# Patient Record
Sex: Female | Born: 1960 | Race: White | Hispanic: No | Marital: Married | Smoking: Never smoker
Health system: Southern US, Community
[De-identification: ages and names within clinical notes are randomized; demographics above are authoritative.]

## PROBLEM LIST (undated history)

## (undated) DIAGNOSIS — D6852 Prothrombin gene mutation: Secondary | ICD-10-CM

## (undated) DIAGNOSIS — Z1589 Genetic susceptibility to other disease: Secondary | ICD-10-CM

## (undated) DIAGNOSIS — E7212 Methylenetetrahydrofolate reductase deficiency: Secondary | ICD-10-CM

---

## 2015-06-27 ENCOUNTER — Encounter (HOSPITAL_COMMUNITY): Payer: Self-pay | Admitting: *Deleted

## 2015-06-27 ENCOUNTER — Emergency Department (HOSPITAL_COMMUNITY): Payer: Managed Care, Other (non HMO)

## 2015-06-27 ENCOUNTER — Emergency Department (HOSPITAL_COMMUNITY)
Admission: EM | Admit: 2015-06-27 | Discharge: 2015-06-28 | Disposition: A | Discharge status: Home / Self Care | Payer: Managed Care, Other (non HMO) | Attending: Emergency Medicine | Admitting: Emergency Medicine

## 2015-06-27 DIAGNOSIS — Z862 Personal history of diseases of the blood and blood-forming organs and certain disorders involving the immune mechanism: Secondary | ICD-10-CM | POA: Diagnosis not present

## 2015-06-27 DIAGNOSIS — Z8639 Personal history of other endocrine, nutritional and metabolic disease: Secondary | ICD-10-CM | POA: Insufficient documentation

## 2015-06-27 DIAGNOSIS — R9431 Abnormal electrocardiogram [ECG] [EKG]: Secondary | ICD-10-CM | POA: Insufficient documentation

## 2015-06-27 DIAGNOSIS — R0789 Other chest pain: Secondary | ICD-10-CM | POA: Diagnosis not present

## 2015-06-27 DIAGNOSIS — R1013 Epigastric pain: Secondary | ICD-10-CM

## 2015-06-27 DIAGNOSIS — R079 Chest pain, unspecified: Secondary | ICD-10-CM | POA: Diagnosis present

## 2015-06-27 HISTORY — DX: Methylenetetrahydrofolate reductase deficiency: E72.12

## 2015-06-27 HISTORY — DX: Genetic susceptibility to other disease: Z15.89

## 2015-06-27 HISTORY — DX: Prothrombin gene mutation: D68.52

## 2015-06-27 LAB — CBC
HEMATOCRIT: 39.1 % (ref 36.0–46.0)
HEMOGLOBIN: 12.7 g/dL (ref 12.0–15.0)
MCH: 30.1 pg (ref 26.0–34.0)
MCHC: 32.5 g/dL (ref 30.0–36.0)
MCV: 92.7 fL (ref 78.0–100.0)
Platelets: 141 10*3/uL — ABNORMAL LOW (ref 150–400)
RBC: 4.22 MIL/uL (ref 3.87–5.11)
RDW: 13.2 % (ref 11.5–15.5)
WBC: 4.7 10*3/uL (ref 4.0–10.5)

## 2015-06-27 LAB — BASIC METABOLIC PANEL
Anion gap: 11 (ref 5–15)
BUN: 15 mg/dL (ref 6–20)
CALCIUM: 9.9 mg/dL (ref 8.9–10.3)
CO2: 28 mmol/L (ref 22–32)
CREATININE: 0.63 mg/dL (ref 0.44–1.00)
Chloride: 103 mmol/L (ref 101–111)
GFR calc non Af Amer: >60 mL/min (ref >60)
Glucose, Bld: 103 mg/dL — ABNORMAL HIGH (ref 65–99)
Potassium: 4.6 mmol/L (ref 3.5–5.1)
SODIUM: 142 mmol/L (ref 135–145)

## 2015-06-27 LAB — I-STAT TROPONIN, ED: TROPONIN I, POC: 0 ng/mL (ref 0.00–0.08)

## 2015-06-27 NOTE — ED Provider Notes (Signed)
CSN: [id number]     Arrival date & time 06/27/15  1812 History  By signing my name below, I, Bethel Born, attest that this documentation has been prepared under the direction and in the presence of Derwood Kaplan, MD. Electronically Signed: Bethel Born, ED Scribe. 06/28/2015. 12:23 AM   Chief Complaint  Patient presents with  . Chest Pain  . Abnormal ECG    The history is provided by the patient. No language interpreter was used.   Deanna Ray is a 55 y.o. female who presents to the Emergency Department complaining of new,  Intermittent,  pressure-like, non-radiating, substernal chest pain with onset last evening. Pt states that her symptoms started with epigastric pain near 3 PM, after lunch. The chest pains started later in the day and lasted for 1.5 hours with no identifiable triggers, pattern, or modifying factors. The chest pain has resolved but she still has some epigastric discomfort. She has been having the epigastric discomfort with associated belching intermittently for several months. That pain typically occurs after meals. Pt denies dizziness, nausea, and SOB. She was seen at Urgent Care and referred to the ED. She has no significant cardiac history or history of DM or HTN. Her family history includes HTN but not cardiac disease. She does not smoke. Pt has used an 81 mg aspirin daily for the last 10 years.    Past Medical History  Diagnosis Date  . MTHFR mutation (methylenetetrahydrofolate reductase) (HCC)   . 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 No pertinent past surgical history. History reviewed. No pertinent family history. Social History  Substance Use Topics  . Smoking status: Never Smoker   . Smokeless tobacco: None  . Alcohol Use: Yes     Comment: occ   OB History    No data available     Review of Systems  10 Systems reviewed and all are negative for acute change except as noted in the HPI.  Allergies  Review of patient's  allergies indicates no known allergies.  Home Medications   Prior to Admission medications   Medication Sig Start Date End Date Taking? Authorizing Provider  ranitidine (ZANTAC) 150 MG tablet Take 1 tablet (150 mg total) by mouth 2 (two) times daily. 06/28/15   Arles Rumbold, MD   BP 112/61 mmHg  Pulse 79  Temp(Src) 97.7 F (36.5 C) (Oral)  Resp 12  SpO2 100% Physical Exam  Constitutional: She is oriented to person, place, and time. She appears well-developed and well-nourished. No distress.  HENT:  Head: Normocephalic and atraumatic.  Eyes: EOM are normal.  Neck: Normal range of motion.  Cardiovascular: Normal rate, regular rhythm and normal heart sounds.   Pulmonary/Chest: Effort normal and breath sounds normal.  CTAB  Abdominal: Soft. She exhibits no distension and no mass. There is no tenderness. There is no rebound and no guarding.  Musculoskeletal: Normal range of motion.  Neurological: She is alert and oriented to person, place, and time.  Skin: Skin is warm and dry.  Psychiatric: She has a normal mood and affect. Judgment normal.  Nursing note and vitals reviewed.   ED Course  Procedures (including critical care time) DIAGNOSTIC STUDIES: Oxygen Saturation is 100% on RA,  normal by my interpretation.    COORDINATION OF CARE: 12:18 AM Discussed treatment plan which includes lab work, CXR, EKG with pt at bedside and pt agreed to plan.  Labs Review Labs Reviewed  BASIC METABOLIC PANEL - Abnormal; Notable for the following:  Glucose, Bld 103 (*)    All other components within normal limits  CBC - Abnormal; Notable for the following:    Platelets 141 (*)    All other components within normal limits  I-STAT TROPOININ, ED  I-STAT TROPOININ, ED    Imaging Review No results found. I have personally reviewed and evaluated these images and lab results as part of my medical decision-making.   EKG Interpretation   Date/Time:  Saturday June 27 2015 18:19:38  EST Ventricular Rate:  61 PR Interval:  156 QRS Duration: 76 QT Interval:  408 QTC Calculation: 410 R Axis:   88 Text Interpretation:  Normal sinus rhythm Possible Left atrial enlargement  Borderline ECG No acute changes normal axis   No old tracing to compare  Confirmed by Rhunette Croft, MD, Janey Genta [phone]) on 06/27/2015 11:58:11 PM      MDM   Final diagnoses:  Acute epigastric pain  Atypical chest pain    I personally performed the services described in this documentation, which was scribed in my presence. The recorded information has been reviewed and is accurate.  Pt comes in with epigastric pain and atypical chest pain. HEAR score < 3 - will get trops x 2. Also  - she has been on baby aspirin for several years - will advise PCP f/u for possible PUD.   Derwood Kaplan, MD 06/30/15 2137

## 2015-06-27 NOTE — ED Notes (Addendum)
Pt reports onset today of upper abd pain, than began radiating into mid chest. Denies sob or n/v. [facility] and sent here due to abnormal ekg. Has clotting disorder, had 11 hr car ride yesterday.

## 2015-06-28 LAB — I-STAT TROPONIN, ED: TROPONIN I, POC: 0 ng/mL (ref 0.00–0.08)

## 2015-06-28 MED ORDER — ASPIRIN 81 MG PO CHEW
162.0000 mg | CHEWABLE_TABLET | Freq: Once | ORAL | Status: AC
  Administered 2015-06-28: 162 mg via ORAL
  Filled 2015-06-28: qty 2

## 2015-06-28 MED ORDER — RANITIDINE HCL 150 MG PO TABS: 150.0000 mg | ORAL_TABLET | Freq: Two times a day (BID) | ORAL | Status: AC

## 2015-06-28 NOTE — Discharge Instructions (Signed)
We saw you in the ER for the chest pain and the abdominal pain. All of our cardiac workup is normal, including labs, EKG and chest X-RAY are normal. We are not sure what is causing your chest discomfort, but we feel comfortable sending you home at this time. The workup in the ER is not complete, and you should follow up with your primary care doctor for further evaluation.  The upper abdomen pain could be due to gastritis. Start Zantac other antacid meds and see your doctor as discussed.   Nonspecific Chest Pain  Chest pain can be caused by many different conditions. There is always a chance that your pain could be related to something serious, such as a heart attack or a blood clot in your lungs. Chest pain can also be caused by conditions that are not life-threatening. If you have chest pain, it is very important to follow up with your health care provider. CAUSES  Chest pain can be caused by:  Heartburn.  Pneumonia or bronchitis.  Anxiety or stress.  Inflammation around your heart (pericarditis) or lung (pleuritis or pleurisy).  A blood clot in your lung.  A collapsed lung (pneumothorax). It can develop suddenly on its own (spontaneous pneumothorax) or from trauma to the chest.  Shingles infection (varicella-zoster virus).  Heart attack.  Damage to the bones, muscles, and cartilage that make up your chest wall. This can include:  Bruised bones due to injury.  Strained muscles or cartilage due to frequent or repeated coughing or overwork.  Fracture to one or more ribs.  Sore cartilage due to inflammation (costochondritis). RISK FACTORS  Risk factors for chest pain may include:  Activities that increase your risk for trauma or injury to your chest.  Respiratory infections or conditions that cause frequent coughing.  Medical conditions or overeating that can cause heartburn.  Heart disease or family history of heart disease.  Conditions or health behaviors that  increase your risk of developing a blood clot.  Having had chicken pox (varicella zoster). SIGNS AND SYMPTOMS Chest pain can feel like:  Burning or tingling on the surface of your chest or deep in your chest.  Crushing, pressure, aching, or squeezing pain.  Dull or sharp pain that is worse when you move, cough, or take a deep breath.  Pain that is also felt in your back, neck, shoulder, or arm, or pain that spreads to any of these areas. Your chest pain may come and go, or it may stay constant. DIAGNOSIS Lab tests or other studies may be needed to find the cause of your pain. Your health care provider may have you take a test called an ambulatory ECG (electrocardiogram). An ECG records your heartbeat patterns at the time the test is performed. You may also have other tests, such as:  Transthoracic echocardiogram (TTE). During echocardiography, sound waves are used to create a picture of all of the heart structures and to look at how blood flows through your heart.  Transesophageal echocardiogram (TEE).This is a more advanced imaging test that obtains images from inside your body. It allows your health care provider to see your heart in finer detail.  Cardiac monitoring. This allows your health care provider to monitor your heart rate and rhythm in real time.  Holter monitor. This is a portable device that records your heartbeat and can help to diagnose abnormal heartbeats. It allows your health care provider to track your heart activity for several days, if needed.  Stress tests. These can be  done through exercise or by taking medicine that makes your heart beat more quickly.  Blood tests.  Imaging tests. TREATMENT  Your treatment depends on what is causing your chest pain. Treatment may include:  Medicines. These may include:  Acid blockers for heartburn.  Anti-inflammatory medicine.  Pain medicine for inflammatory conditions.  Antibiotic medicine, if an infection is  present.  Medicines to dissolve blood clots.  Medicines to treat coronary artery disease.  Supportive care for conditions that do not require medicines. This may include:  Resting.  Applying heat or cold packs to injured areas.  Limiting activities until pain decreases. HOME CARE INSTRUCTIONS  If you were prescribed an antibiotic medicine, finish it all even if you start to feel better.  Avoid any activities that bring on chest pain.  Do not use any tobacco products, including cigarettes, chewing tobacco, or electronic cigarettes. If you need help quitting, ask your health care provider.  Do not drink alcohol.  Take medicines only as directed by your health care provider.  Keep all follow-up visits as directed by your health care provider. This is important. This includes any further testing if your chest pain does not go away.  If heartburn is the cause for your chest pain, you may be told to keep your head raised (elevated) while sleeping. This reduces the chance that acid will go from your stomach into your esophagus.  Make lifestyle changes as directed by your health care provider. These may include:  Getting regular exercise. Ask your health care provider to suggest some activities that are safe for you.  Eating a heart-healthy diet. A registered dietitian can help you to learn healthy eating options.  Maintaining a healthy weight.  Managing diabetes, if necessary.  Reducing stress. SEEK MEDICAL CARE IF:  Your chest pain does not go away after treatment.  You have a rash with blisters on your chest.  You have a fever. SEEK IMMEDIATE MEDICAL CARE IF:   Your chest pain is worse.  You have an increasing cough, or you cough up blood.  You have severe abdominal pain.  You have severe weakness.  You faint.  You have chills.  You have sudden, unexplained chest discomfort.  You have sudden, unexplained discomfort in your arms, back, neck, or jaw.  You  have shortness of breath at any time.  You suddenly start to sweat, or your skin gets clammy.  You feel nauseous or you vomit.  You suddenly feel light-headed or dizzy.  Your heart begins to beat quickly, or it feels like it is skipping beats. These symptoms may represent a serious problem that is an emergency. Do not wait to see if the symptoms will go away. Get medical help right away. Call your local emergency services (911 in the [place] Do not drive yourself to the hospital.   This information is not intended to replace advice given to you by your health care provider. Make sure you discuss any questions you have with your health care provider.   Document Released: 02/02/2005 Document Revised: 05/16/2014 Document Reviewed: 11/29/2013 Elsevier Interactive Patient Education 2016 [employer] Peptic Ulcer A peptic ulcer is a sore in the lining of your esophagus (esophageal ulcer), stomach (gastric ulcer), or in the first part of your small intestine (duodenal ulcer). The ulcer causes erosion into the deeper tissue. CAUSES  Normally, the lining of the stomach and the small intestine protects itself from the acid that digests food. The protective lining can be damaged by:  An  infection caused by a bacterium called Helicobacter pylori (H. pylori).  Regular use of nonsteroidal anti-inflammatory drugs (NSAIDs), such as ibuprofen or aspirin.  Smoking tobacco. Other risk factors include being older than 50, drinking alcohol excessively, and having a family history of ulcer disease.  SYMPTOMS   Burning pain or gnawing in the area between the chest and the belly button.  Heartburn.  Nausea and vomiting.  Bloating. The pain can be worse on an empty stomach and at night. If the ulcer results in bleeding, it can cause:  Black, tarry stools.  Vomiting of bright red blood.  Vomiting of coffee-ground-looking materials. DIAGNOSIS  A diagnosis is usually made based upon your history and  an exam. Other tests and procedures may be performed to find the cause of the ulcer. Finding a cause will help determine the best treatment. Tests and procedures may include:  Blood tests, stool tests, or breath tests to check for the bacterium H. pylori.  An upper gastrointestinal (GI) series of the esophagus, stomach, and small intestine.  An endoscopy to examine the esophagus, stomach, and small intestine.  A biopsy. TREATMENT  Treatment may include:  Eliminating the cause of the ulcer, such as smoking, NSAIDs, or alcohol.  Medicines to reduce the amount of acid in your digestive tract.  Antibiotic medicines if the ulcer is caused by the H. pylori bacterium.  An upper endoscopy to treat a bleeding ulcer.  Surgery if the bleeding is severe or if the ulcer created a hole somewhere in the digestive system. HOME CARE INSTRUCTIONS   Avoid tobacco, alcohol, and caffeine. Smoking can increase the acid in the stomach, and continued smoking will impair the healing of ulcers.  Avoid foods and drinks that seem to cause discomfort or aggravate your ulcer.  Only take medicines as directed by your caregiver. Do not substitute over-the-counter medicines for prescription medicines without talking to your caregiver.  Keep any follow-up appointments and tests as directed. SEEK MEDICAL CARE IF:   Your do not improve within 7 days of starting treatment.  You have ongoing indigestion or heartburn. SEEK IMMEDIATE MEDICAL CARE IF:   You have sudden, sharp, or persistent abdominal pain.  You have bloody or dark black, tarry stools.  You vomit blood or vomit that looks like coffee grounds.  You become light-headed, weak, or feel faint.  You become sweaty or clammy. MAKE SURE YOU:   Understand these instructions.  Will watch your condition.  Will get help right away if you are not doing well or get worse.   This information is not intended to replace advice given to you by your health  care provider. Make sure you discuss any questions you have with your health care provider.   Document Released: 04/22/2000 Document Revised: 05/16/2014 Document Reviewed: 11/23/2011 Elsevier Interactive Patient Education [employer]

## 2017-03-13 IMAGING — DX DG CHEST 2V
2 series · 2 of 2 positions shown · non-contrast
Comparison: None.

CLINICAL DATA: Upper abdominal pain beginning today radiating to
mid chest.

EXAM:
CHEST  2 VIEW

[w chest pa]
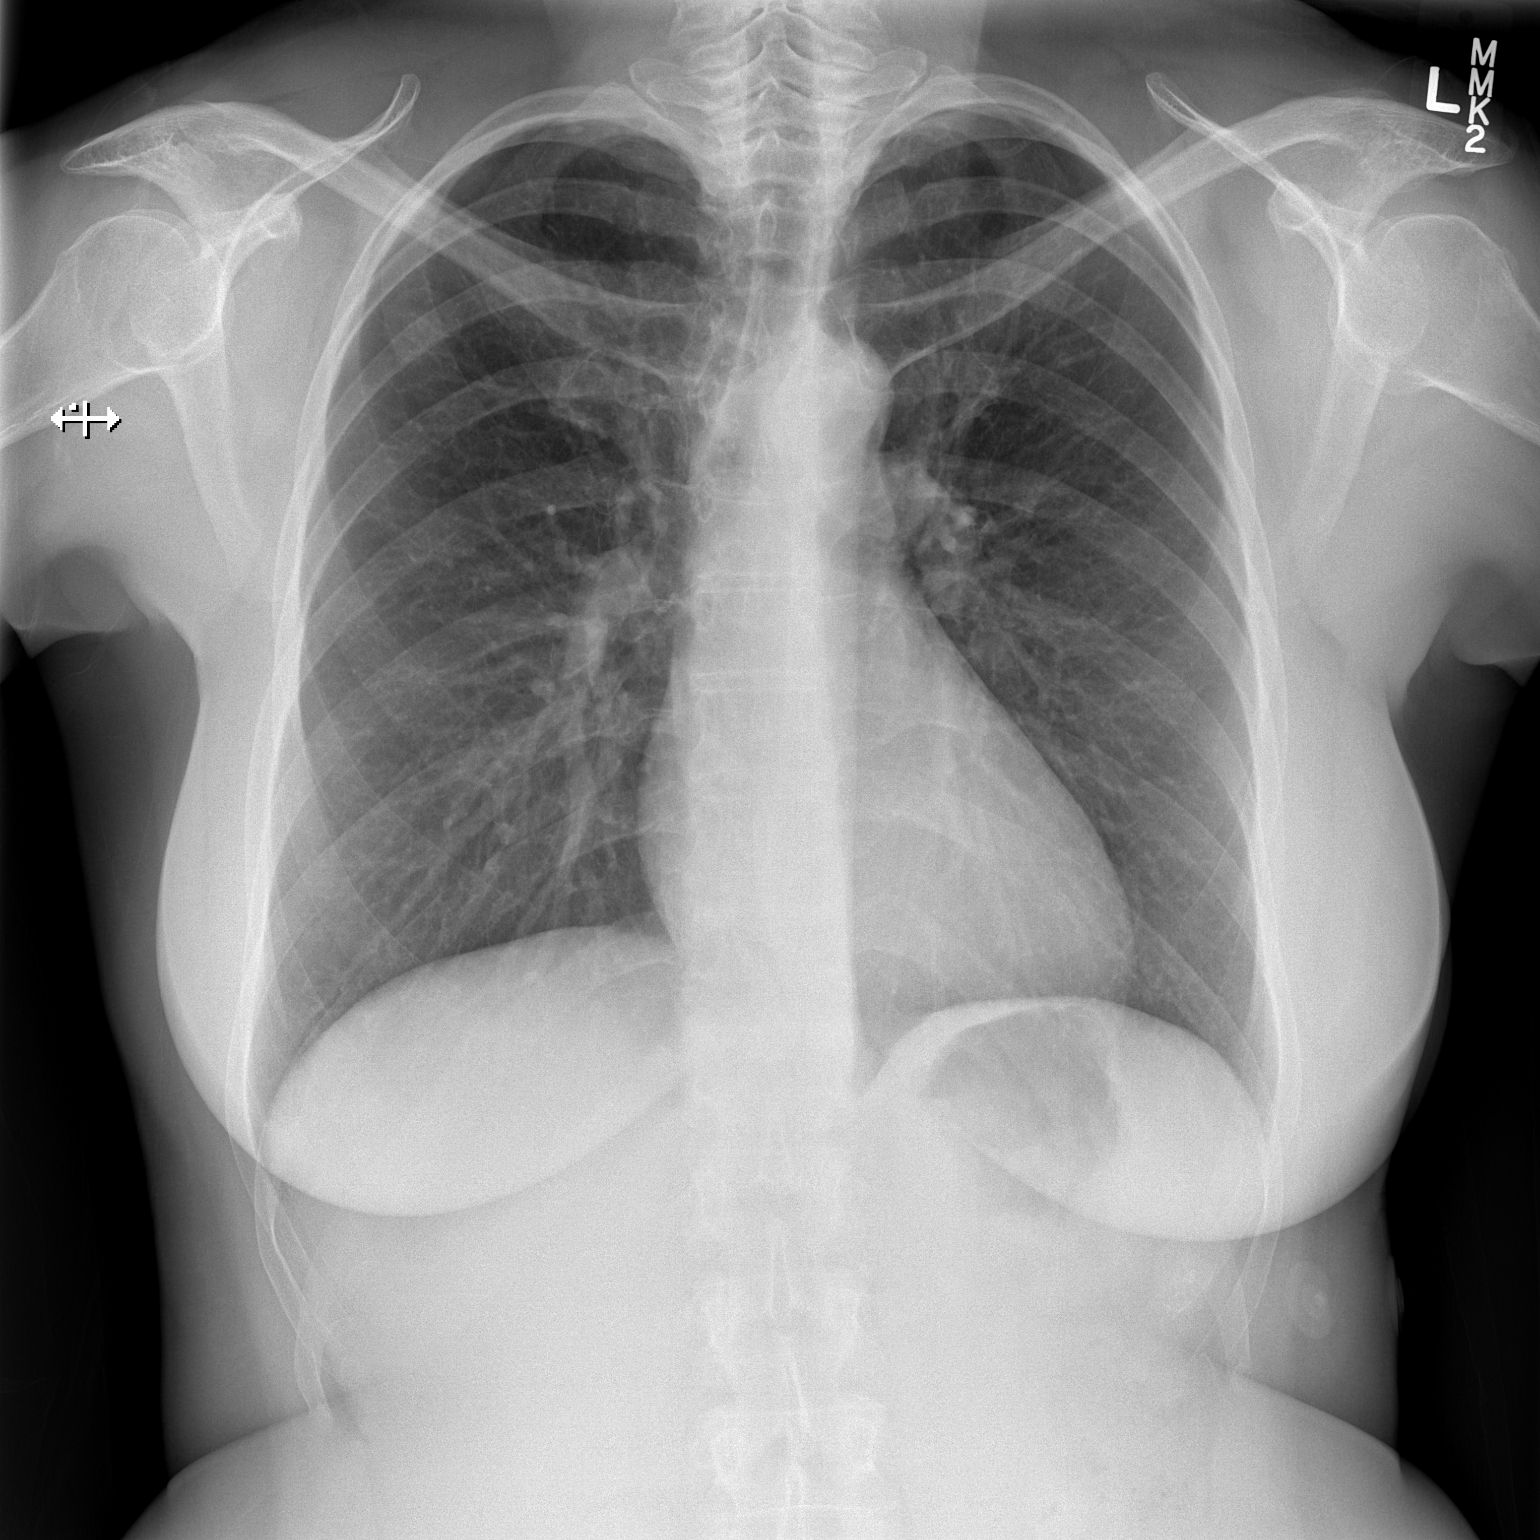

[w chest lat]
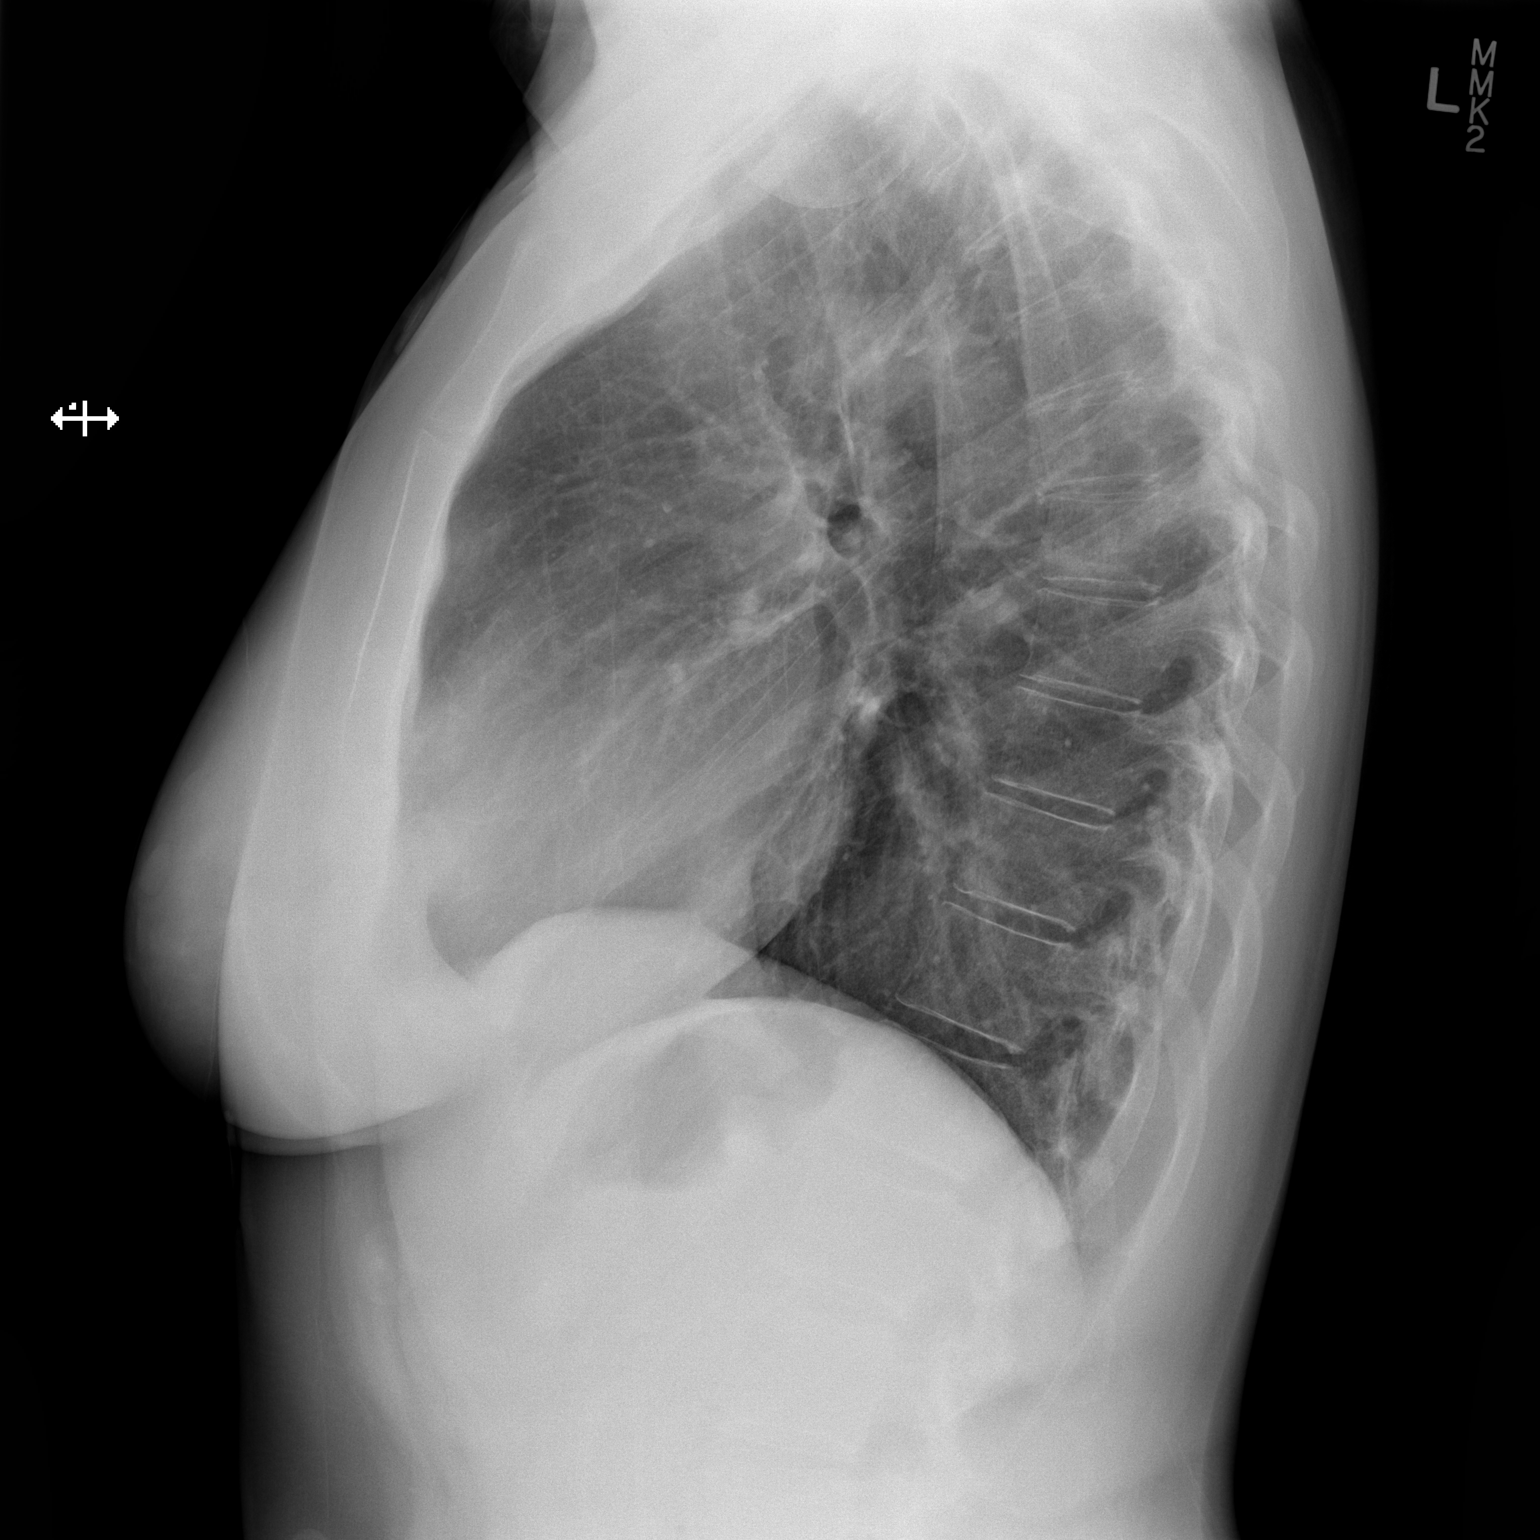

[2 of 2 positions shown; findings below may reference images not displayed]

FINDINGS: Lungs are adequately inflated and otherwise clear. Cardiomediastinal
silhouette is within normal. Remaining bones and soft tissues are
normal.
IMPRESSION: No active cardiopulmonary disease.
